# Patient Record
Sex: Male | Born: 2000 | Hispanic: Yes | Marital: Single | Smoking: Never smoker
Health system: Southern US, Community
[De-identification: ages and names within clinical notes are randomized; demographics above are authoritative.]

---
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
## 2020-07-08 IMAGING — CR DG CHEST 2V
2 series · 2 of 2 positions shown · non-contrast
Comparison: 12/19/2017

CLINICAL DATA: Right side chest pain for 5 days. Concern for
pneumomediastinum.

EXAM:
CHEST - 2 VIEW

[w chest pa 8-[id] (15-22cm)]
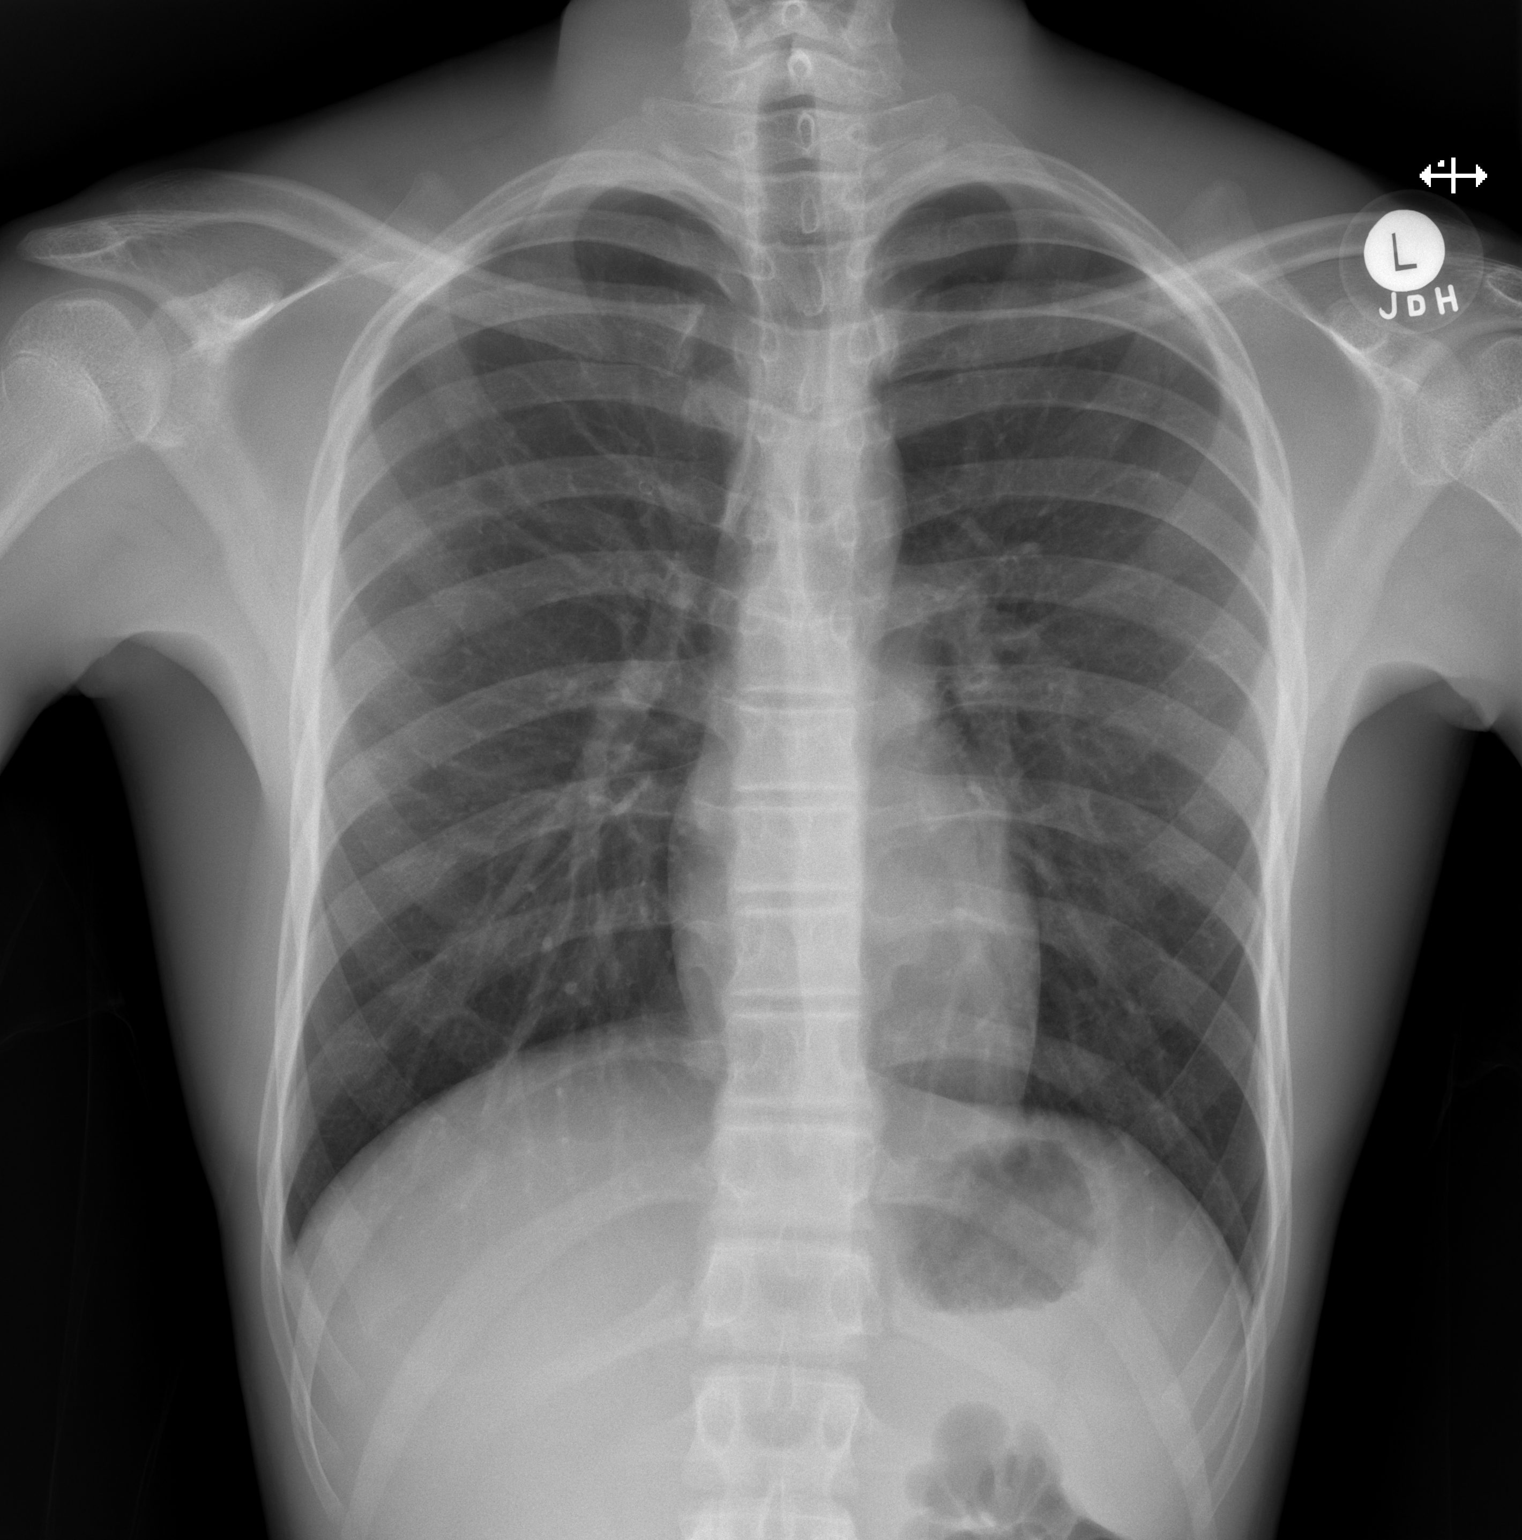

[w chest lat 8-[id] (21-28cm)]
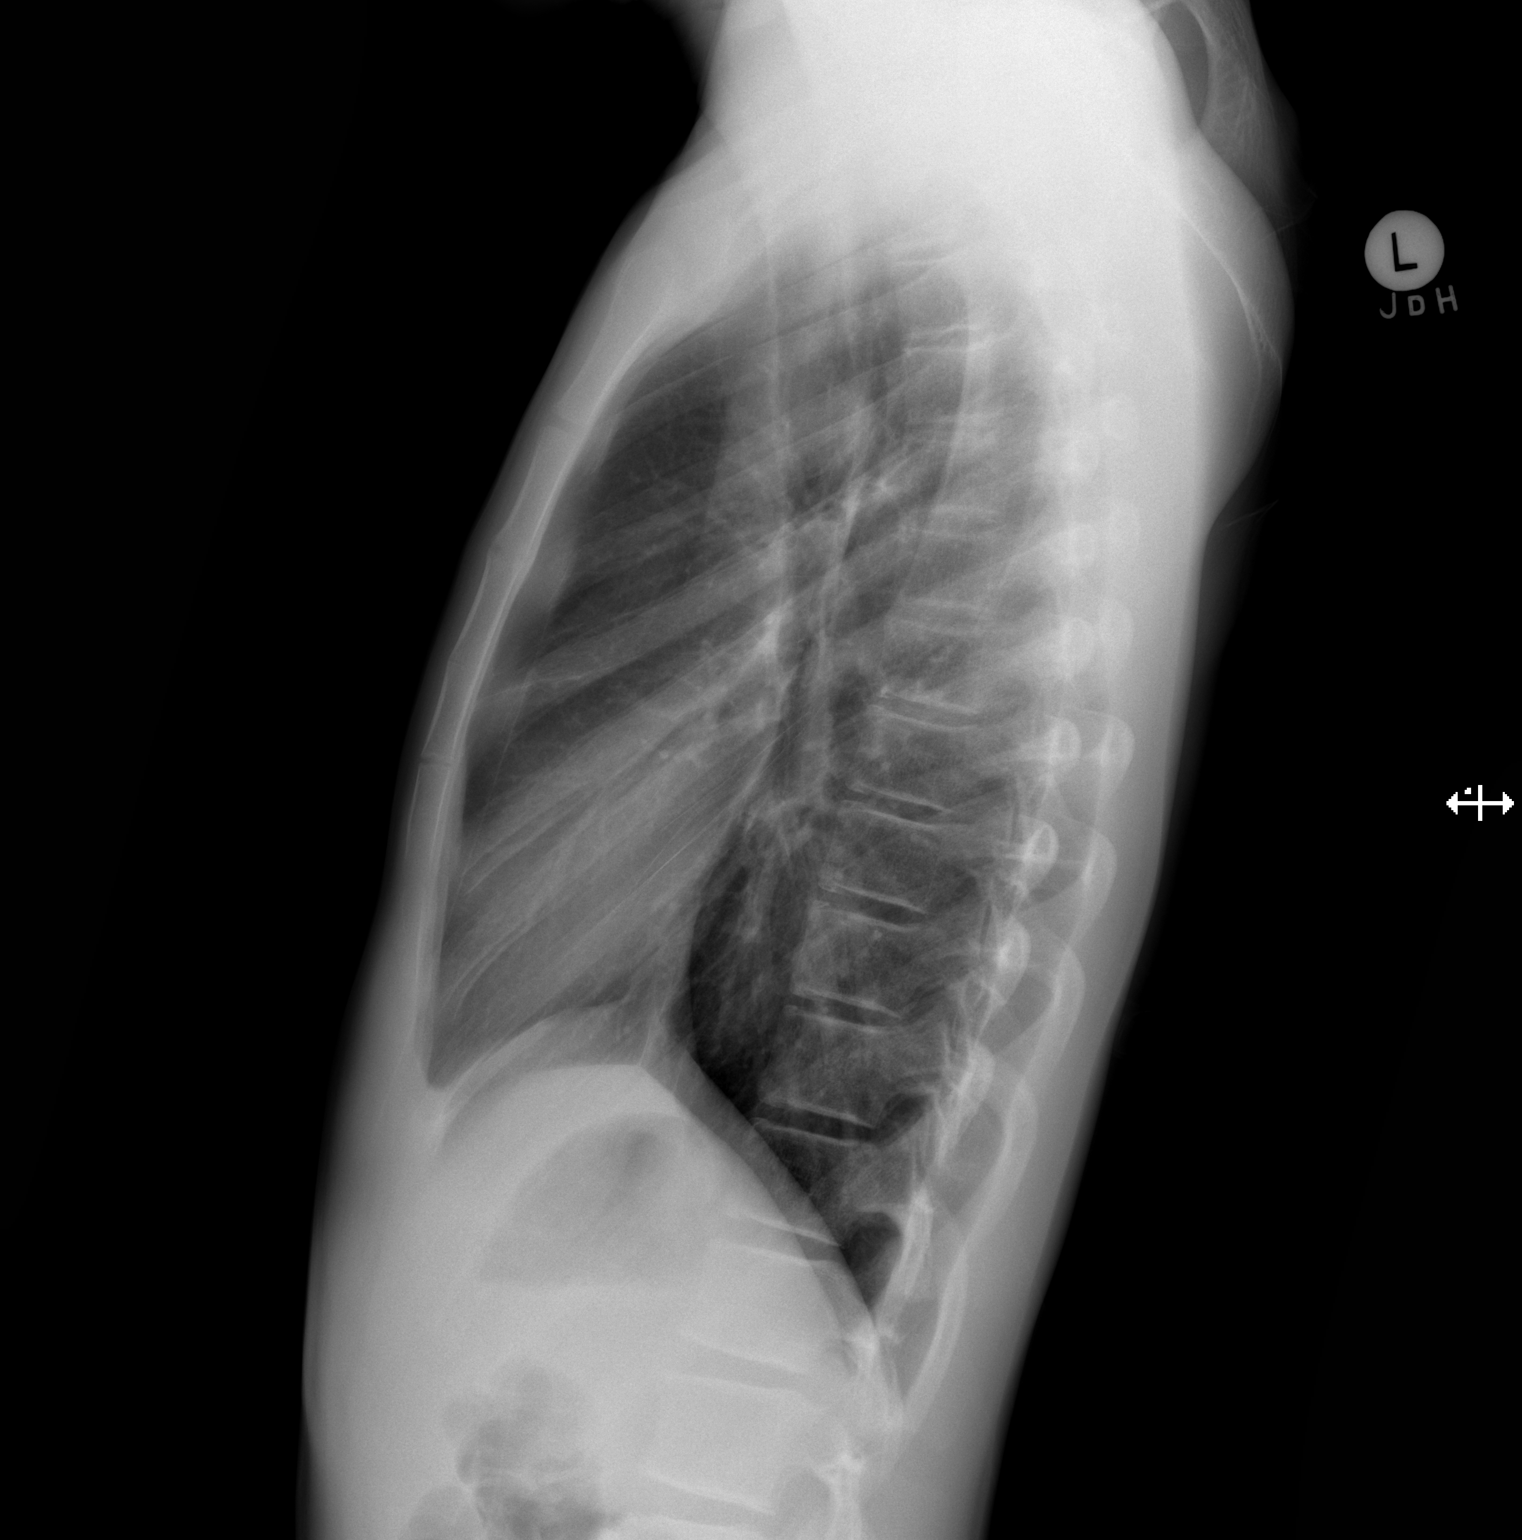

[2 of 2 positions shown; findings below may reference images not displayed]

FINDINGS: Previously seen pneumomediastinum no longer visualized. No
pneumothorax. Lungs clear. Heart is normal size. No bony
abnormality.
IMPRESSION: Resolution of the previously seen pneumomediastinum extending into
the neck. No pneumomediastinum or pneumothorax.
